# Patient Record
Sex: Male | Born: 1960 | Race: Black or African American | Hispanic: No | Marital: Single | Smoking: Never smoker
Health system: Southern US, Community
[De-identification: ages and names within clinical notes are randomized; demographics above are authoritative.]

## PROBLEM LIST (undated history)

## (undated) DIAGNOSIS — N529 Male erectile dysfunction, unspecified: Secondary | ICD-10-CM

## (undated) DIAGNOSIS — E785 Hyperlipidemia, unspecified: Secondary | ICD-10-CM

## (undated) DIAGNOSIS — R7303 Prediabetes: Secondary | ICD-10-CM

## (undated) DIAGNOSIS — Z2989 Encounter for other specified prophylactic measures: Secondary | ICD-10-CM

## (undated) DIAGNOSIS — I10 Essential (primary) hypertension: Secondary | ICD-10-CM

## (undated) DIAGNOSIS — I119 Hypertensive heart disease without heart failure: Secondary | ICD-10-CM

## (undated) DIAGNOSIS — R748 Abnormal levels of other serum enzymes: Secondary | ICD-10-CM

## (undated) DIAGNOSIS — Z8546 Personal history of malignant neoplasm of prostate: Secondary | ICD-10-CM

## (undated) DIAGNOSIS — J45909 Unspecified asthma, uncomplicated: Secondary | ICD-10-CM

## (undated) DIAGNOSIS — Z9079 Acquired absence of other genital organ(s): Secondary | ICD-10-CM

## (undated) HISTORY — DX: Male erectile dysfunction, unspecified: N52.9

## (undated) HISTORY — DX: Abnormal levels of other serum enzymes: R74.8

## (undated) HISTORY — DX: Personal history of malignant neoplasm of prostate: Z85.46

## (undated) HISTORY — DX: Essential (primary) hypertension: I10

## (undated) HISTORY — DX: Hyperlipidemia, unspecified: E78.5

## (undated) HISTORY — DX: Encounter for other specified prophylactic measures: Z29.89

## (undated) HISTORY — PX: PROSTATECTOMY: SHX69

## (undated) HISTORY — DX: Hypertensive heart disease without heart failure: I11.9

## (undated) HISTORY — DX: Unspecified asthma, uncomplicated: J45.909

## (undated) HISTORY — DX: Acquired absence of other genital organ(s): Z90.79

## (undated) HISTORY — DX: Prediabetes: R73.03

---

## 2015-07-12 ENCOUNTER — Other Ambulatory Visit: Payer: Self-pay | Admitting: Infectious Disease

## 2015-07-12 ENCOUNTER — Ambulatory Visit
Admission: RE | Admit: 2015-07-12 | Discharge: 2015-07-12 | Disposition: A | Discharge status: Home / Self Care | Payer: No Typology Code available for payment source | Source: Ambulatory Visit | Attending: Infectious Disease | Admitting: Infectious Disease

## 2015-07-12 DIAGNOSIS — R7611 Nonspecific reaction to tuberculin skin test without active tuberculosis: Secondary | ICD-10-CM

## 2019-05-18 DIAGNOSIS — Z01118 Encounter for examination of ears and hearing with other abnormal findings: Secondary | ICD-10-CM | POA: Diagnosis not present

## 2019-05-18 DIAGNOSIS — I119 Hypertensive heart disease without heart failure: Secondary | ICD-10-CM | POA: Diagnosis not present

## 2019-05-18 DIAGNOSIS — Z131 Encounter for screening for diabetes mellitus: Secondary | ICD-10-CM | POA: Diagnosis not present

## 2019-05-18 DIAGNOSIS — Z0001 Encounter for general adult medical examination with abnormal findings: Secondary | ICD-10-CM | POA: Diagnosis not present

## 2019-05-18 DIAGNOSIS — Z1329 Encounter for screening for other suspected endocrine disorder: Secondary | ICD-10-CM | POA: Diagnosis not present

## 2019-05-18 DIAGNOSIS — Z01021 Encounter for examination of eyes and vision following failed vision screening with abnormal findings: Secondary | ICD-10-CM | POA: Diagnosis not present

## 2019-05-18 DIAGNOSIS — Z136 Encounter for screening for cardiovascular disorders: Secondary | ICD-10-CM | POA: Diagnosis not present

## 2019-05-18 DIAGNOSIS — J45909 Unspecified asthma, uncomplicated: Secondary | ICD-10-CM | POA: Diagnosis not present

## 2019-06-08 DIAGNOSIS — I119 Hypertensive heart disease without heart failure: Secondary | ICD-10-CM | POA: Diagnosis not present

## 2019-06-08 DIAGNOSIS — F5221 Male erectile disorder: Secondary | ICD-10-CM | POA: Diagnosis not present

## 2019-06-08 DIAGNOSIS — E782 Mixed hyperlipidemia: Secondary | ICD-10-CM | POA: Diagnosis not present

## 2019-06-08 DIAGNOSIS — I10 Essential (primary) hypertension: Secondary | ICD-10-CM | POA: Diagnosis not present

## 2019-07-21 DIAGNOSIS — C61 Malignant neoplasm of prostate: Secondary | ICD-10-CM | POA: Diagnosis not present

## 2019-07-28 DIAGNOSIS — C61 Malignant neoplasm of prostate: Secondary | ICD-10-CM | POA: Diagnosis not present

## 2019-07-28 DIAGNOSIS — R339 Retention of urine, unspecified: Secondary | ICD-10-CM | POA: Diagnosis not present

## 2019-09-07 DIAGNOSIS — I10 Essential (primary) hypertension: Secondary | ICD-10-CM | POA: Diagnosis not present

## 2019-09-07 DIAGNOSIS — H9202 Otalgia, left ear: Secondary | ICD-10-CM | POA: Diagnosis not present

## 2019-09-07 DIAGNOSIS — R222 Localized swelling, mass and lump, trunk: Secondary | ICD-10-CM | POA: Diagnosis not present

## 2019-09-07 DIAGNOSIS — F5221 Male erectile disorder: Secondary | ICD-10-CM | POA: Diagnosis not present

## 2019-09-07 DIAGNOSIS — I119 Hypertensive heart disease without heart failure: Secondary | ICD-10-CM | POA: Diagnosis not present

## 2019-09-07 DIAGNOSIS — E782 Mixed hyperlipidemia: Secondary | ICD-10-CM | POA: Diagnosis not present

## 2019-10-19 DIAGNOSIS — H938X3 Other specified disorders of ear, bilateral: Secondary | ICD-10-CM | POA: Diagnosis not present

## 2019-10-19 DIAGNOSIS — H6123 Impacted cerumen, bilateral: Secondary | ICD-10-CM | POA: Diagnosis not present

## 2019-11-09 DIAGNOSIS — H6123 Impacted cerumen, bilateral: Secondary | ICD-10-CM | POA: Diagnosis not present

## 2020-02-18 DIAGNOSIS — C61 Malignant neoplasm of prostate: Secondary | ICD-10-CM | POA: Diagnosis not present

## 2020-04-10 DIAGNOSIS — Z1389 Encounter for screening for other disorder: Secondary | ICD-10-CM | POA: Diagnosis not present

## 2020-04-10 DIAGNOSIS — I119 Hypertensive heart disease without heart failure: Secondary | ICD-10-CM | POA: Diagnosis not present

## 2020-04-10 DIAGNOSIS — Z131 Encounter for screening for diabetes mellitus: Secondary | ICD-10-CM | POA: Diagnosis not present

## 2020-04-10 DIAGNOSIS — Z125 Encounter for screening for malignant neoplasm of prostate: Secondary | ICD-10-CM | POA: Diagnosis not present

## 2020-04-10 DIAGNOSIS — I10 Essential (primary) hypertension: Secondary | ICD-10-CM | POA: Diagnosis not present

## 2020-04-10 DIAGNOSIS — Z0110 Encounter for examination of ears and hearing without abnormal findings: Secondary | ICD-10-CM | POA: Diagnosis not present

## 2020-04-10 DIAGNOSIS — F5221 Male erectile disorder: Secondary | ICD-10-CM | POA: Diagnosis not present

## 2020-04-10 DIAGNOSIS — E782 Mixed hyperlipidemia: Secondary | ICD-10-CM | POA: Diagnosis not present

## 2020-04-10 DIAGNOSIS — Z0000 Encounter for general adult medical examination without abnormal findings: Secondary | ICD-10-CM | POA: Diagnosis not present

## 2020-04-25 DIAGNOSIS — D485 Neoplasm of uncertain behavior of skin: Secondary | ICD-10-CM | POA: Diagnosis not present

## 2020-04-25 DIAGNOSIS — D235 Other benign neoplasm of skin of trunk: Secondary | ICD-10-CM | POA: Diagnosis not present

## 2020-10-10 DIAGNOSIS — F5221 Male erectile disorder: Secondary | ICD-10-CM | POA: Diagnosis not present

## 2020-10-10 DIAGNOSIS — E782 Mixed hyperlipidemia: Secondary | ICD-10-CM | POA: Diagnosis not present

## 2020-10-10 DIAGNOSIS — I10 Essential (primary) hypertension: Secondary | ICD-10-CM | POA: Diagnosis not present

## 2020-10-10 DIAGNOSIS — I119 Hypertensive heart disease without heart failure: Secondary | ICD-10-CM | POA: Diagnosis not present

## 2020-11-29 DIAGNOSIS — C61 Malignant neoplasm of prostate: Secondary | ICD-10-CM | POA: Diagnosis not present

## 2020-12-06 DIAGNOSIS — C61 Malignant neoplasm of prostate: Secondary | ICD-10-CM | POA: Diagnosis not present

## 2021-01-17 DIAGNOSIS — F5221 Male erectile disorder: Secondary | ICD-10-CM | POA: Diagnosis not present

## 2021-01-17 DIAGNOSIS — Z298 Encounter for other specified prophylactic measures: Secondary | ICD-10-CM | POA: Diagnosis not present

## 2021-01-17 DIAGNOSIS — E782 Mixed hyperlipidemia: Secondary | ICD-10-CM | POA: Diagnosis not present

## 2021-01-17 DIAGNOSIS — I119 Hypertensive heart disease without heart failure: Secondary | ICD-10-CM | POA: Diagnosis not present

## 2021-01-17 DIAGNOSIS — I10 Essential (primary) hypertension: Secondary | ICD-10-CM | POA: Diagnosis not present

## 2021-01-22 DIAGNOSIS — Z20822 Contact with and (suspected) exposure to covid-19: Secondary | ICD-10-CM | POA: Diagnosis not present

## 2021-04-16 DIAGNOSIS — Z0001 Encounter for general adult medical examination with abnormal findings: Secondary | ICD-10-CM | POA: Diagnosis not present

## 2021-04-16 DIAGNOSIS — Z136 Encounter for screening for cardiovascular disorders: Secondary | ICD-10-CM | POA: Diagnosis not present

## 2021-04-16 DIAGNOSIS — F5221 Male erectile disorder: Secondary | ICD-10-CM | POA: Diagnosis not present

## 2021-04-16 DIAGNOSIS — I10 Essential (primary) hypertension: Secondary | ICD-10-CM | POA: Diagnosis not present

## 2021-04-16 DIAGNOSIS — E782 Mixed hyperlipidemia: Secondary | ICD-10-CM | POA: Diagnosis not present

## 2021-04-16 DIAGNOSIS — Z131 Encounter for screening for diabetes mellitus: Secondary | ICD-10-CM | POA: Diagnosis not present

## 2021-04-16 DIAGNOSIS — Z125 Encounter for screening for malignant neoplasm of prostate: Secondary | ICD-10-CM | POA: Diagnosis not present

## 2021-04-16 DIAGNOSIS — J4530 Mild persistent asthma, uncomplicated: Secondary | ICD-10-CM | POA: Diagnosis not present

## 2021-07-09 ENCOUNTER — Other Ambulatory Visit: Payer: Self-pay

## 2021-07-09 ENCOUNTER — Ambulatory Visit: Payer: Self-pay

## 2021-07-09 ENCOUNTER — Other Ambulatory Visit: Payer: Self-pay | Admitting: Family Medicine

## 2021-07-09 DIAGNOSIS — M545 Low back pain, unspecified: Secondary | ICD-10-CM

## 2021-08-10 ENCOUNTER — Encounter (HOSPITAL_COMMUNITY): Payer: Self-pay | Admitting: Radiology

## 2021-10-12 ENCOUNTER — Other Ambulatory Visit (HOSPITAL_BASED_OUTPATIENT_CLINIC_OR_DEPARTMENT_OTHER): Payer: Self-pay

## 2021-10-12 ENCOUNTER — Ambulatory Visit: Payer: No Typology Code available for payment source | Attending: Internal Medicine

## 2021-10-12 DIAGNOSIS — Z23 Encounter for immunization: Secondary | ICD-10-CM

## 2021-10-12 MED ORDER — MODERNA COVID-19 BIVAL BOOSTER 50 MCG/0.5ML IM SUSP
INTRAMUSCULAR | 0 refills | Status: AC
  Filled 2021-10-12: qty 0.5, 1d supply, fill #0

## 2021-10-12 NOTE — Progress Notes (Signed)
   Covid-19 Vaccination Clinic  Name:  Lucas Garrison    MRN: [id number] DOB: 10-Sep-1961  10/12/2021  Mr. Lucas Garrison was observed post Covid-19 immunization for 15 minutes without incident. He was provided with Vaccine Information Sheet and instruction to access the V-Safe system.   Mr. Lucas Garrison was instructed to call 911 with any severe reactions post vaccine: Difficulty breathing  Swelling of face and throat  A fast heartbeat  A bad rash all over body  Dizziness and weakness   Immunizations Administered     Name Date Dose VIS Date Route   Moderna Covid-19 vaccine Bivalent Booster 10/12/2021  8:09 AM 0.5 mL 07/07/2021 Intramuscular   Manufacturer: Gala Murdoch   Lot: [id number]   NDC: 67591-638-46

## 2022-01-16 DIAGNOSIS — F5221 Male erectile disorder: Secondary | ICD-10-CM | POA: Diagnosis not present

## 2022-01-16 DIAGNOSIS — I10 Essential (primary) hypertension: Secondary | ICD-10-CM | POA: Diagnosis not present

## 2022-01-16 DIAGNOSIS — J4530 Mild persistent asthma, uncomplicated: Secondary | ICD-10-CM | POA: Diagnosis not present

## 2022-01-16 DIAGNOSIS — I119 Hypertensive heart disease without heart failure: Secondary | ICD-10-CM | POA: Diagnosis not present

## 2022-01-16 DIAGNOSIS — E782 Mixed hyperlipidemia: Secondary | ICD-10-CM | POA: Diagnosis not present

## 2022-01-30 DIAGNOSIS — I10 Essential (primary) hypertension: Secondary | ICD-10-CM | POA: Diagnosis not present

## 2022-01-30 DIAGNOSIS — F5221 Male erectile disorder: Secondary | ICD-10-CM | POA: Diagnosis not present

## 2022-01-30 DIAGNOSIS — J4530 Mild persistent asthma, uncomplicated: Secondary | ICD-10-CM | POA: Diagnosis not present

## 2022-01-30 DIAGNOSIS — E782 Mixed hyperlipidemia: Secondary | ICD-10-CM | POA: Diagnosis not present

## 2022-02-06 DIAGNOSIS — C61 Malignant neoplasm of prostate: Secondary | ICD-10-CM | POA: Diagnosis not present

## 2022-02-13 DIAGNOSIS — C61 Malignant neoplasm of prostate: Secondary | ICD-10-CM | POA: Diagnosis not present

## 2022-02-18 DIAGNOSIS — F5221 Male erectile disorder: Secondary | ICD-10-CM | POA: Diagnosis not present

## 2022-02-18 DIAGNOSIS — R748 Abnormal levels of other serum enzymes: Secondary | ICD-10-CM | POA: Diagnosis not present

## 2022-02-18 DIAGNOSIS — Z298 Encounter for other specified prophylactic measures: Secondary | ICD-10-CM | POA: Diagnosis not present

## 2022-02-18 DIAGNOSIS — I10 Essential (primary) hypertension: Secondary | ICD-10-CM | POA: Diagnosis not present

## 2022-03-23 IMAGING — DX DG LUMBAR SPINE COMPLETE 4+V
5 series · 5 of 5 positions shown · non-contrast
Comparison: CT Abdomen and Pelvis 08/28/2012.

CLINICAL DATA: 60-year-old male with persistent back pain after
heavy lifting last month.

EXAM:
LUMBAR SPINE - COMPLETE 4+ VIEW

[l-spine ap]
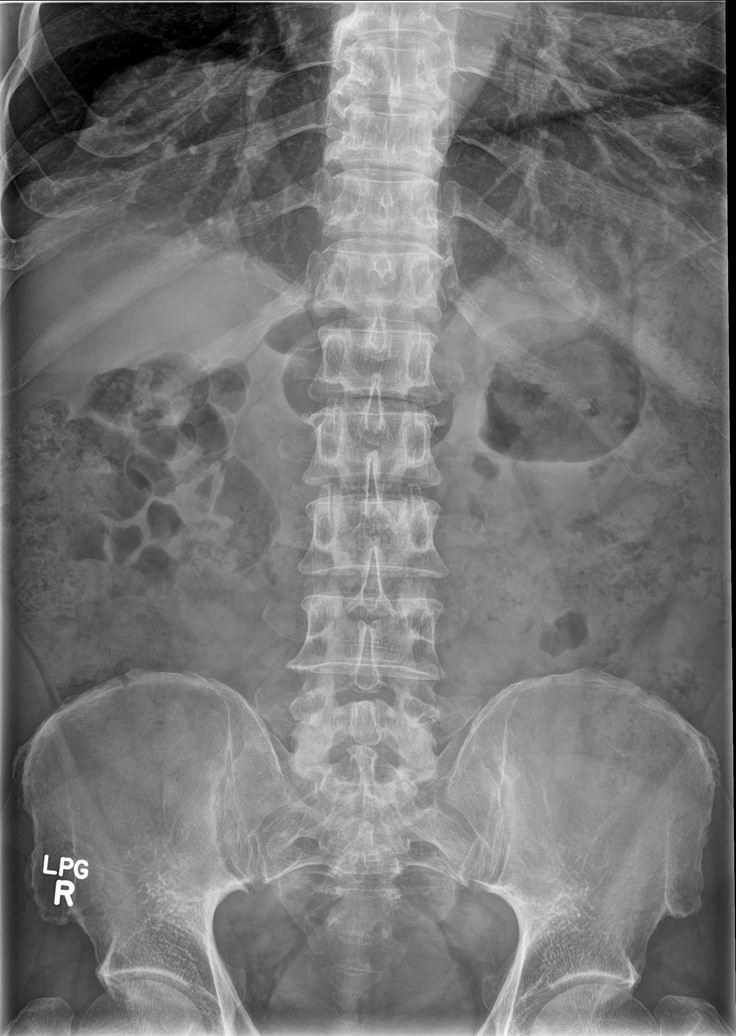

[l-spine obl (1 of 2)]
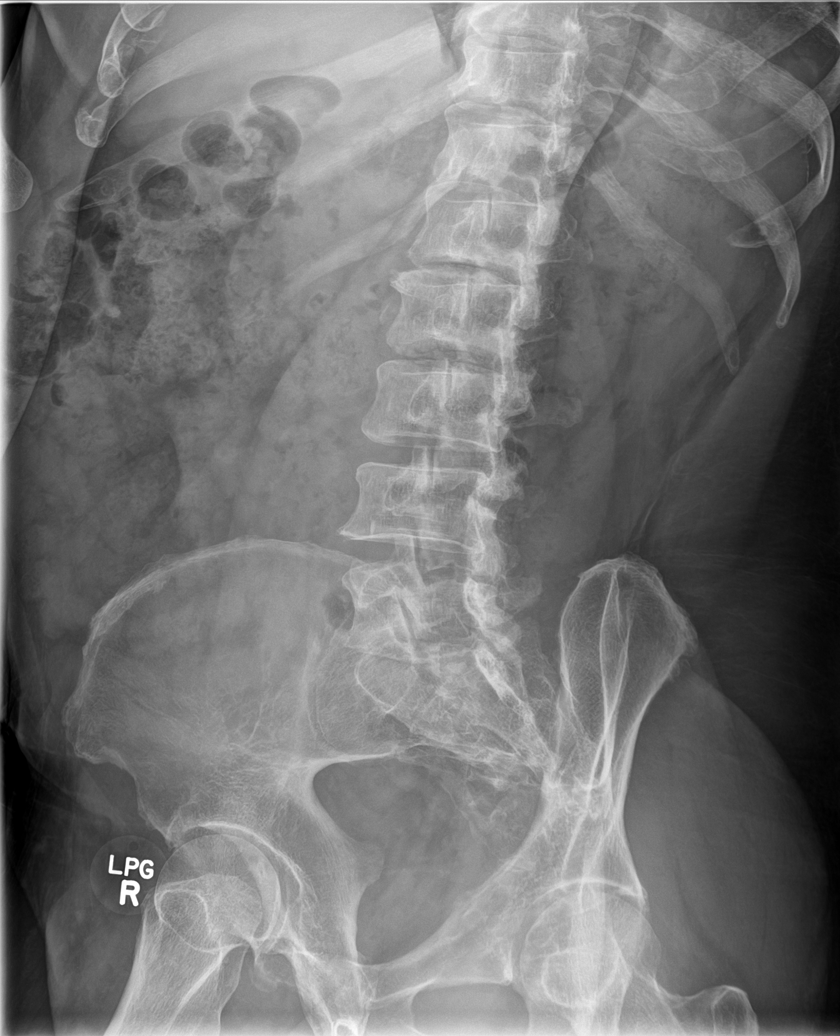

[l-spine obl (2 of 2)]
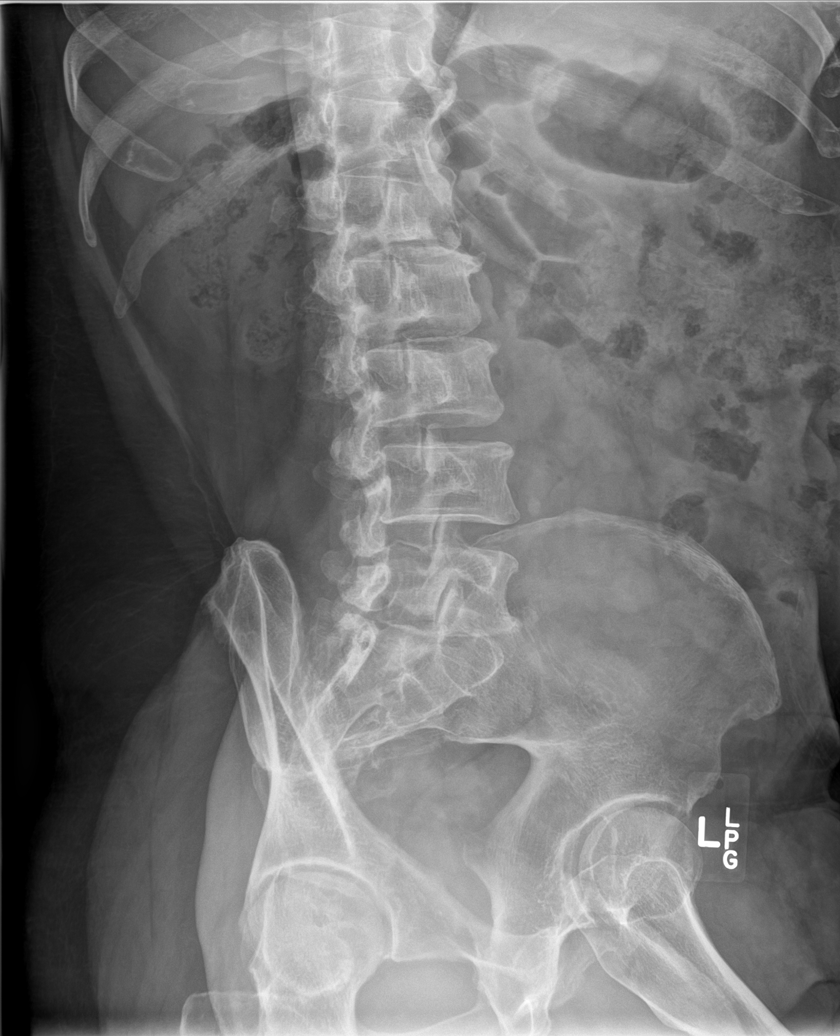

[l-spine lat]
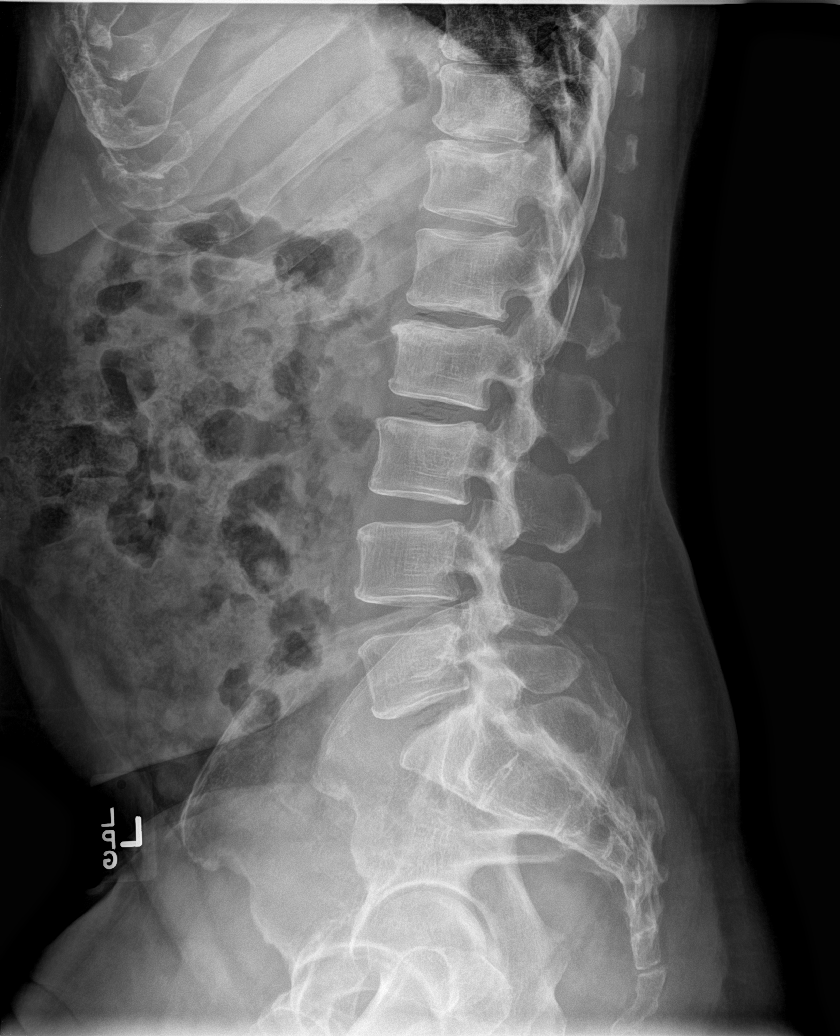

[l-spine spot]
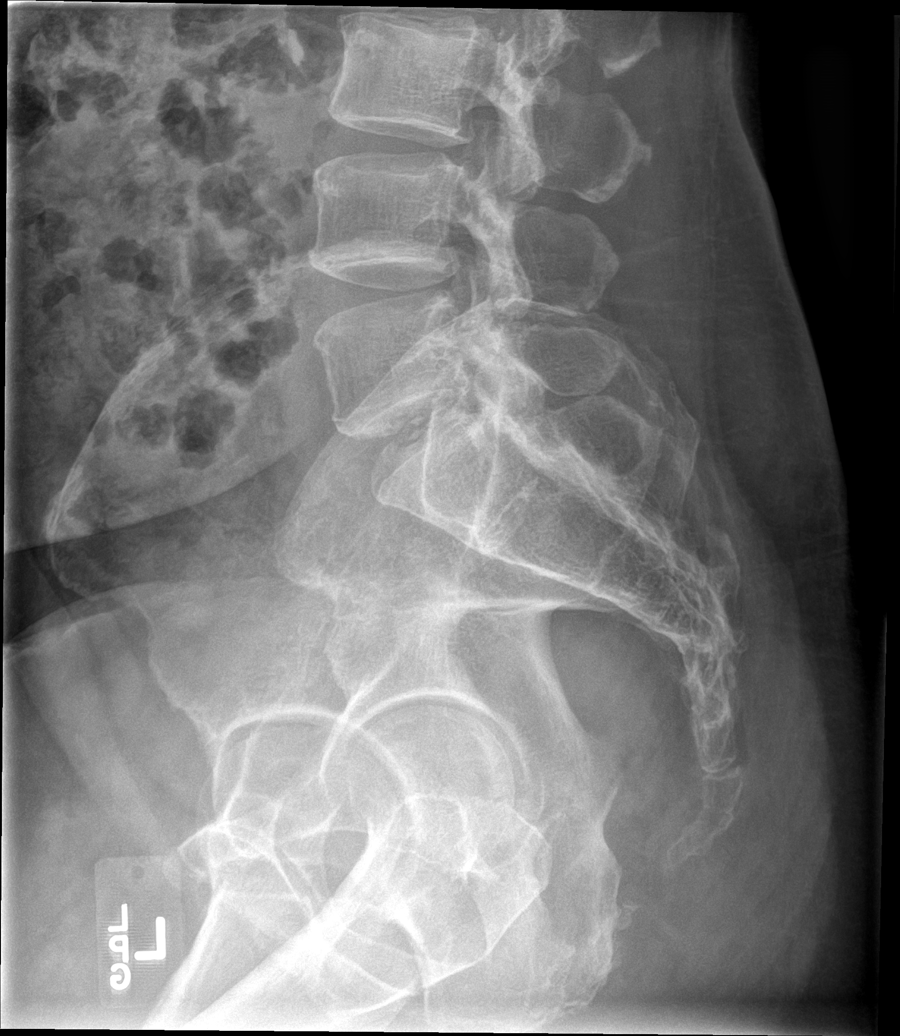

[5 of 5 positions shown; findings below may reference images not displayed]

FINDINGS: Normal lumbar segmentation. There is mild chronic T12 superior
endplate irregularity which on the 9808 comparison appears mostly
related to Schmorl's node. Mild increased superior endplate
sclerosis at that level. L2 disc space loss with new vacuum disc and
mild L2 superior endplate compression, but also L1-L2 endplate
sclerosis. The L3, L4, and L5 vertebrae appear stable. L2-L3 and
L5-S1 vacuum disc is also new. No pars fracture. Sacral ala and SI
joints appear within normal limits. Negative visible abdominal and
pelvic visceral contours.
IMPRESSION: 1. Difficult to exclude mild L2 superior endplate compression,
although endplate changes there may instead be related to interval
L1-L2 disc degeneration (with new vacuum disc there).
Noncontrast Lumbar MRI versus a Nuclear Medicine Whole-body Bone
Scan would be more sensitive and specific.

2. No other acute osseous abnormality identified. A mild T12
superior endplate deformity is stable since [DATE]. L2-L3 and L5-S1 vacuum disc has also developed since [DATE].

## 2022-06-10 DIAGNOSIS — Z125 Encounter for screening for malignant neoplasm of prostate: Secondary | ICD-10-CM | POA: Diagnosis not present

## 2022-06-10 DIAGNOSIS — Z131 Encounter for screening for diabetes mellitus: Secondary | ICD-10-CM | POA: Diagnosis not present

## 2022-06-10 DIAGNOSIS — R5383 Other fatigue: Secondary | ICD-10-CM | POA: Diagnosis not present

## 2022-06-10 DIAGNOSIS — Z0001 Encounter for general adult medical examination with abnormal findings: Secondary | ICD-10-CM | POA: Diagnosis not present

## 2022-06-10 DIAGNOSIS — F5221 Male erectile disorder: Secondary | ICD-10-CM | POA: Diagnosis not present

## 2022-06-10 DIAGNOSIS — I10 Essential (primary) hypertension: Secondary | ICD-10-CM | POA: Diagnosis not present

## 2022-06-10 DIAGNOSIS — E782 Mixed hyperlipidemia: Secondary | ICD-10-CM | POA: Diagnosis not present

## 2022-06-10 DIAGNOSIS — Z1329 Encounter for screening for other suspected endocrine disorder: Secondary | ICD-10-CM | POA: Diagnosis not present

## 2022-06-10 DIAGNOSIS — J4530 Mild persistent asthma, uncomplicated: Secondary | ICD-10-CM | POA: Diagnosis not present

## 2022-08-06 DIAGNOSIS — I10 Essential (primary) hypertension: Secondary | ICD-10-CM | POA: Diagnosis not present

## 2022-08-06 DIAGNOSIS — F5221 Male erectile disorder: Secondary | ICD-10-CM | POA: Diagnosis not present

## 2022-08-06 DIAGNOSIS — R052 Subacute cough: Secondary | ICD-10-CM | POA: Diagnosis not present

## 2022-08-06 DIAGNOSIS — J4530 Mild persistent asthma, uncomplicated: Secondary | ICD-10-CM | POA: Diagnosis not present

## 2022-10-29 DIAGNOSIS — J4530 Mild persistent asthma, uncomplicated: Secondary | ICD-10-CM | POA: Diagnosis not present

## 2022-10-29 DIAGNOSIS — F5221 Male erectile disorder: Secondary | ICD-10-CM | POA: Diagnosis not present

## 2022-10-29 DIAGNOSIS — I10 Essential (primary) hypertension: Secondary | ICD-10-CM | POA: Diagnosis not present

## 2022-10-29 DIAGNOSIS — E782 Mixed hyperlipidemia: Secondary | ICD-10-CM | POA: Diagnosis not present

## 2023-03-26 DIAGNOSIS — C61 Malignant neoplasm of prostate: Secondary | ICD-10-CM | POA: Diagnosis not present

## 2023-04-16 DIAGNOSIS — I10 Essential (primary) hypertension: Secondary | ICD-10-CM | POA: Diagnosis not present

## 2023-04-16 DIAGNOSIS — J4530 Mild persistent asthma, uncomplicated: Secondary | ICD-10-CM | POA: Diagnosis not present

## 2023-04-16 DIAGNOSIS — Z125 Encounter for screening for malignant neoplasm of prostate: Secondary | ICD-10-CM | POA: Diagnosis not present

## 2023-04-16 DIAGNOSIS — Z0001 Encounter for general adult medical examination with abnormal findings: Secondary | ICD-10-CM | POA: Diagnosis not present

## 2023-04-16 DIAGNOSIS — Z136 Encounter for screening for cardiovascular disorders: Secondary | ICD-10-CM | POA: Diagnosis not present

## 2023-04-16 DIAGNOSIS — R7303 Prediabetes: Secondary | ICD-10-CM | POA: Diagnosis not present

## 2023-04-16 DIAGNOSIS — E782 Mixed hyperlipidemia: Secondary | ICD-10-CM | POA: Diagnosis not present

## 2023-04-16 DIAGNOSIS — Z131 Encounter for screening for diabetes mellitus: Secondary | ICD-10-CM | POA: Diagnosis not present

## 2023-04-16 DIAGNOSIS — F5221 Male erectile disorder: Secondary | ICD-10-CM | POA: Diagnosis not present

## 2023-06-10 DIAGNOSIS — C61 Malignant neoplasm of prostate: Secondary | ICD-10-CM | POA: Diagnosis not present

## 2023-06-18 DIAGNOSIS — F5221 Male erectile disorder: Secondary | ICD-10-CM | POA: Diagnosis not present

## 2023-06-18 DIAGNOSIS — I10 Essential (primary) hypertension: Secondary | ICD-10-CM | POA: Diagnosis not present

## 2023-06-18 DIAGNOSIS — E782 Mixed hyperlipidemia: Secondary | ICD-10-CM | POA: Diagnosis not present

## 2023-06-18 DIAGNOSIS — J4530 Mild persistent asthma, uncomplicated: Secondary | ICD-10-CM | POA: Diagnosis not present

## 2023-08-11 DIAGNOSIS — E782 Mixed hyperlipidemia: Secondary | ICD-10-CM | POA: Diagnosis not present

## 2023-08-11 DIAGNOSIS — J4530 Mild persistent asthma, uncomplicated: Secondary | ICD-10-CM | POA: Diagnosis not present

## 2023-08-11 DIAGNOSIS — R7303 Prediabetes: Secondary | ICD-10-CM | POA: Diagnosis not present

## 2023-08-11 DIAGNOSIS — R55 Syncope and collapse: Secondary | ICD-10-CM | POA: Diagnosis not present

## 2023-08-11 DIAGNOSIS — I10 Essential (primary) hypertension: Secondary | ICD-10-CM | POA: Diagnosis not present

## 2023-08-18 DIAGNOSIS — F5221 Male erectile disorder: Secondary | ICD-10-CM | POA: Diagnosis not present

## 2023-08-18 DIAGNOSIS — E782 Mixed hyperlipidemia: Secondary | ICD-10-CM | POA: Diagnosis not present

## 2023-08-18 DIAGNOSIS — I10 Essential (primary) hypertension: Secondary | ICD-10-CM | POA: Diagnosis not present

## 2023-08-18 DIAGNOSIS — J4530 Mild persistent asthma, uncomplicated: Secondary | ICD-10-CM | POA: Diagnosis not present

## 2023-08-18 DIAGNOSIS — Z23 Encounter for immunization: Secondary | ICD-10-CM | POA: Diagnosis not present

## 2023-08-25 NOTE — Progress Notes (Unsigned)
  Cardiology Office Note:  .   Date:  08/25/2023  ID:  Zola Button, DOB 1961-08-17, MRN [id number] PCP: No primary care provider on file.  [facility] Providers Cardiologist:  None { Click to update primary MD,subspecialty MD or APP then REFRESH:1}   History of Present Illness: .   Olof Marcil is a 62 y.o. male ***  ROS  Risk Assessment/Calculations:   {Does this patient have ATRIAL FIBRILLATION?:[phone]} No BP recorded.  {Refresh Note OR Click here to enter BP  :1}***        No results found for: "CHOL", "HDL", "LDLCALC", "LDLDIRECT", "TRIG", "CHOLHDL" No results found for: "NA", "K", "CO2", "GLUCOSE", "BUN", "CREATININE", "CALCIUM", "GFR", "EGFR", "GFRNONAA" No results found for: "WBC", "HGB", "HCT", "MCV", "PLT"  External Labs:  ***  Physical Exam:   VS:  There were no vitals taken for this visit.   Wt Readings from Last 3 Encounters:  No data found for Wt     Physical Exam   Studies Reviewed: .        ***  ASSESSMENT AND PLAN: .    No diagnosis found.  There are no diagnoses linked to this encounter.      {Are you ordering a CV Procedure (e.g. stress test, cath, DCCV, TEE, etc)?   Press F2        :[id number]}  Dispo: ***  Signed,  Yates Decamp, MD, [facility] 08/25/2023, 9:32 PM

## 2023-08-26 ENCOUNTER — Encounter: Payer: Self-pay | Admitting: Cardiology

## 2023-08-26 ENCOUNTER — Ambulatory Visit: Payer: BC Managed Care – PPO | Attending: Cardiology | Admitting: Cardiology

## 2023-08-26 VITALS — BP 120/72 | HR 80 | Resp 16 | Ht 62.0 in | Wt 144.0 lb

## 2023-08-26 DIAGNOSIS — I10 Essential (primary) hypertension: Secondary | ICD-10-CM

## 2023-08-26 DIAGNOSIS — R55 Syncope and collapse: Secondary | ICD-10-CM

## 2023-08-26 DIAGNOSIS — R0609 Other forms of dyspnea: Secondary | ICD-10-CM | POA: Diagnosis not present

## 2023-08-26 DIAGNOSIS — E7800 Pure hypercholesterolemia, unspecified: Secondary | ICD-10-CM | POA: Diagnosis not present

## 2023-08-26 NOTE — Patient Instructions (Signed)
Medication Instructions:  Your physician recommends that you continue on your current medications as directed. Please refer to the Current Medication list given to you today.  *If you need a refill on your cardiac medications before your next appointment, please call your pharmacy*   Lab Work: none If you have labs (blood work) drawn today and your tests are completely normal, you will receive your results only by: MyChart Message (if you have MyChart) OR A paper copy in the mail If you have any lab test that is abnormal or we need to change your treatment, we will call you to review the results.   Testing/Procedures: none   Follow-Up: At [facility], you and your health needs are our priority.  As part of our continuing mission to provide you with exceptional heart care, we have created designated Provider Care Teams.  These Care Teams include your primary Cardiologist (physician) and Advanced Practice Providers (APPs -  Physician Assistants and Nurse Practitioners) who all work together to provide you with the care you need, when you need it.  We recommend signing up for the patient portal called "MyChart".  Sign up information is provided on this After Visit Summary.  MyChart is used to connect with patients for Virtual Visits (Telemedicine).  Patients are able to view lab/test results, encounter notes, upcoming appointments, etc.  Non-urgent messages can be sent to your provider as well.   To learn more about what you can do with MyChart, go to [email or web address].    Your next appointment:   As needed  Provider:   Yates Decamp, MD     Other Instructions

## 2023-10-06 DIAGNOSIS — R7303 Prediabetes: Secondary | ICD-10-CM | POA: Diagnosis not present

## 2023-10-06 DIAGNOSIS — E782 Mixed hyperlipidemia: Secondary | ICD-10-CM | POA: Diagnosis not present

## 2023-10-06 DIAGNOSIS — J4530 Mild persistent asthma, uncomplicated: Secondary | ICD-10-CM | POA: Diagnosis not present

## 2023-10-06 DIAGNOSIS — I10 Essential (primary) hypertension: Secondary | ICD-10-CM | POA: Diagnosis not present

## 2023-10-20 DIAGNOSIS — I10 Essential (primary) hypertension: Secondary | ICD-10-CM | POA: Diagnosis not present

## 2023-11-10 DIAGNOSIS — J4530 Mild persistent asthma, uncomplicated: Secondary | ICD-10-CM | POA: Diagnosis not present

## 2023-11-10 DIAGNOSIS — I10 Essential (primary) hypertension: Secondary | ICD-10-CM | POA: Diagnosis not present

## 2023-11-10 DIAGNOSIS — E782 Mixed hyperlipidemia: Secondary | ICD-10-CM | POA: Diagnosis not present

## 2023-11-10 DIAGNOSIS — F5221 Male erectile disorder: Secondary | ICD-10-CM | POA: Diagnosis not present
